# Patient Record
Sex: Female | Born: 1985 | Race: Black or African American | Hispanic: No | Marital: Married | Smoking: Never smoker
Health system: Southern US, Community
[De-identification: ages and names within clinical notes are randomized; demographics above are authoritative.]

---
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
## 2010-07-11 ENCOUNTER — Emergency Department (HOSPITAL_COMMUNITY)
Admission: EM | Admit: 2010-07-11 | Discharge: 2010-07-11 | Payer: Self-pay | Source: Home / Self Care | Admitting: Emergency Medicine

## 2010-10-27 LAB — URINALYSIS, ROUTINE W REFLEX MICROSCOPIC
Bilirubin Urine: NEGATIVE
Hgb urine dipstick: NEGATIVE
Ketones, ur: NEGATIVE mg/dL
Nitrite: NEGATIVE
Specific Gravity, Urine: 1.015 (ref 1.005–1.030)
Urobilinogen, UA: 1 mg/dL (ref 0.0–1.0)

## 2010-10-27 LAB — GC/CHLAMYDIA PROBE AMP, GENITAL
Chlamydia, DNA Probe: POSITIVE — AB
GC Probe Amp, Genital: NEGATIVE

## 2010-10-27 LAB — WET PREP, GENITAL
Trich, Wet Prep: NONE SEEN
Yeast Wet Prep HPF POC: NONE SEEN

## 2011-12-27 IMAGING — CR DG CHEST 2V
2 series · 2 of 2 positions shown · non-contrast
Comparison: None.

CLINICAL DATA: Weakness and coughing up blood.

CHEST - 2 VIEW

[w chest pa *]
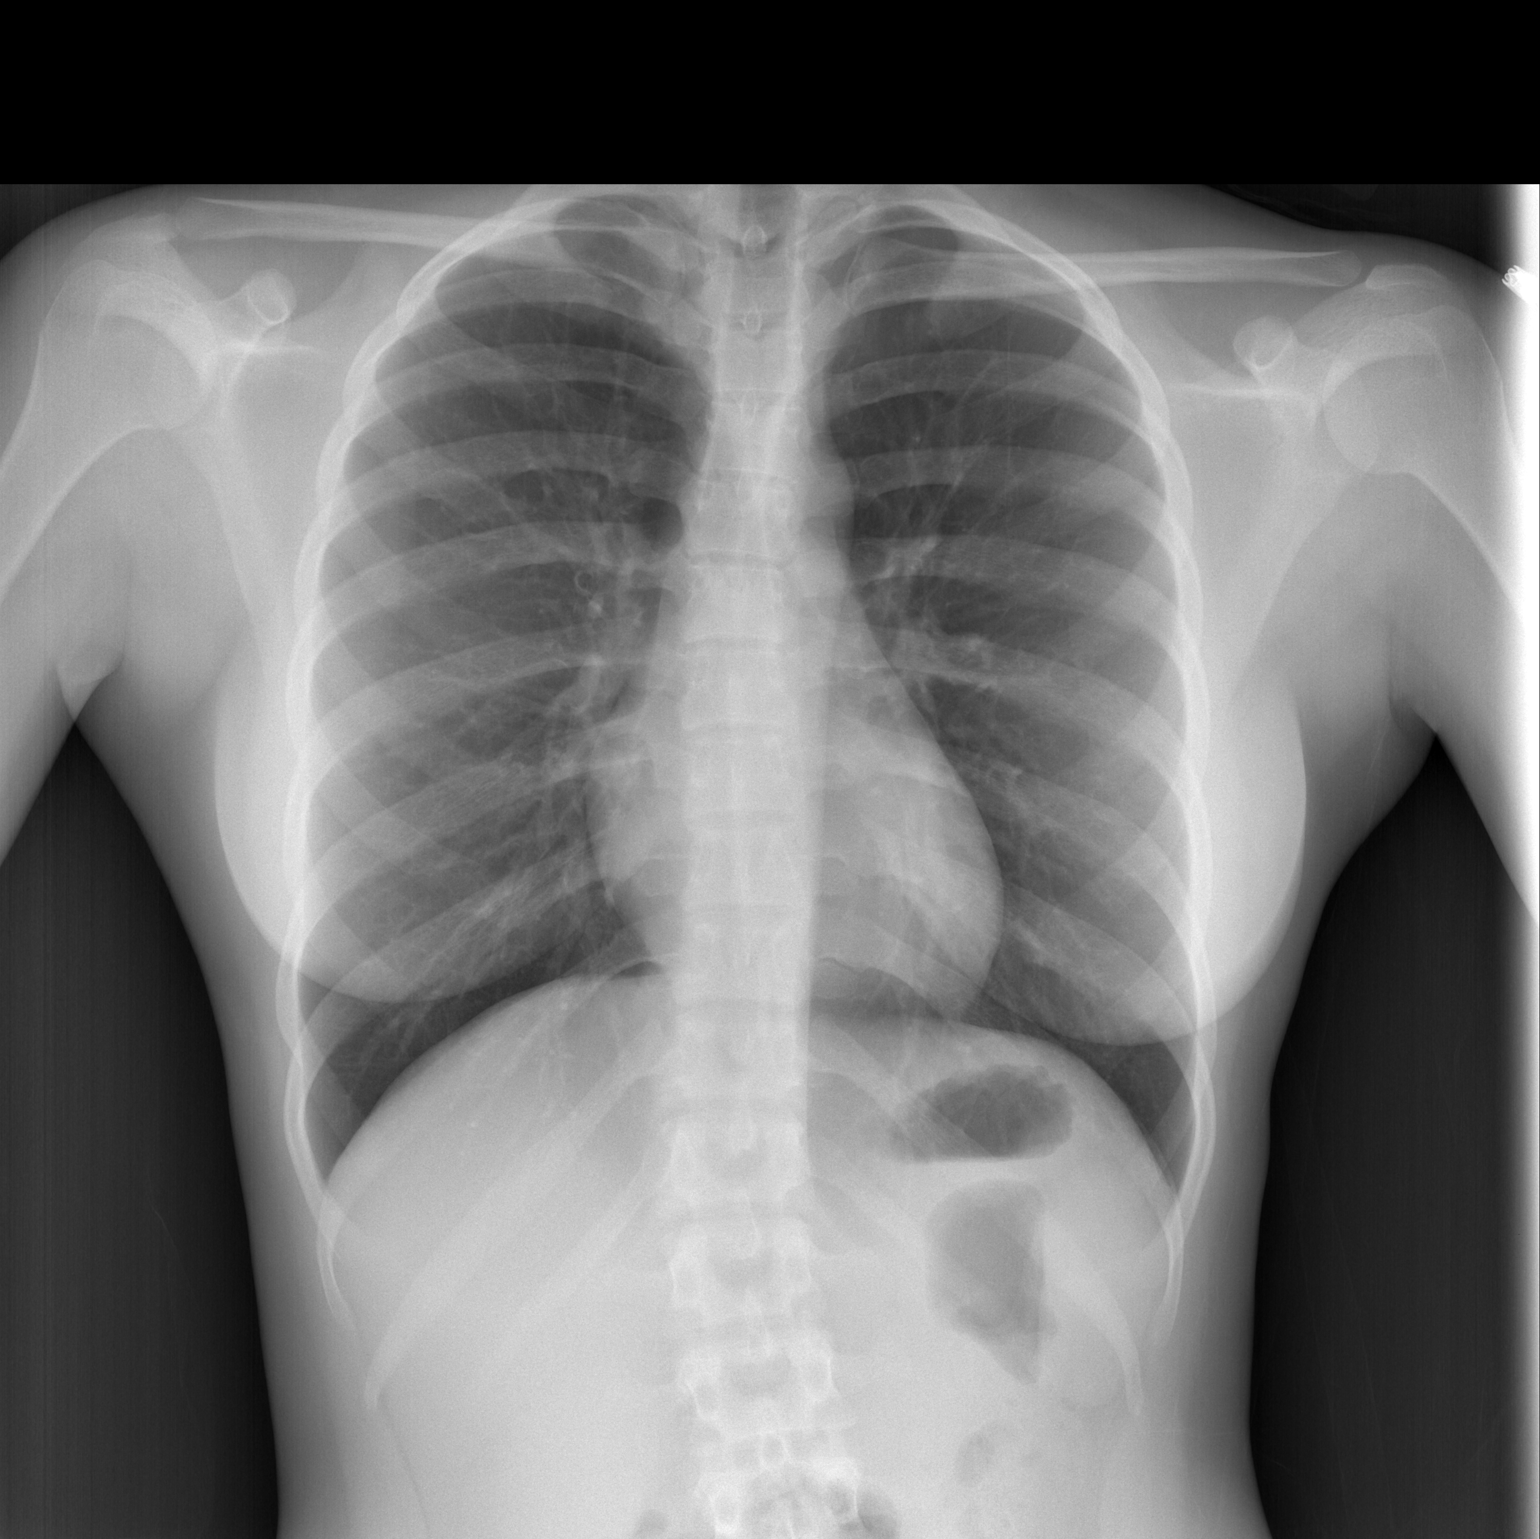

[w chest lat *]
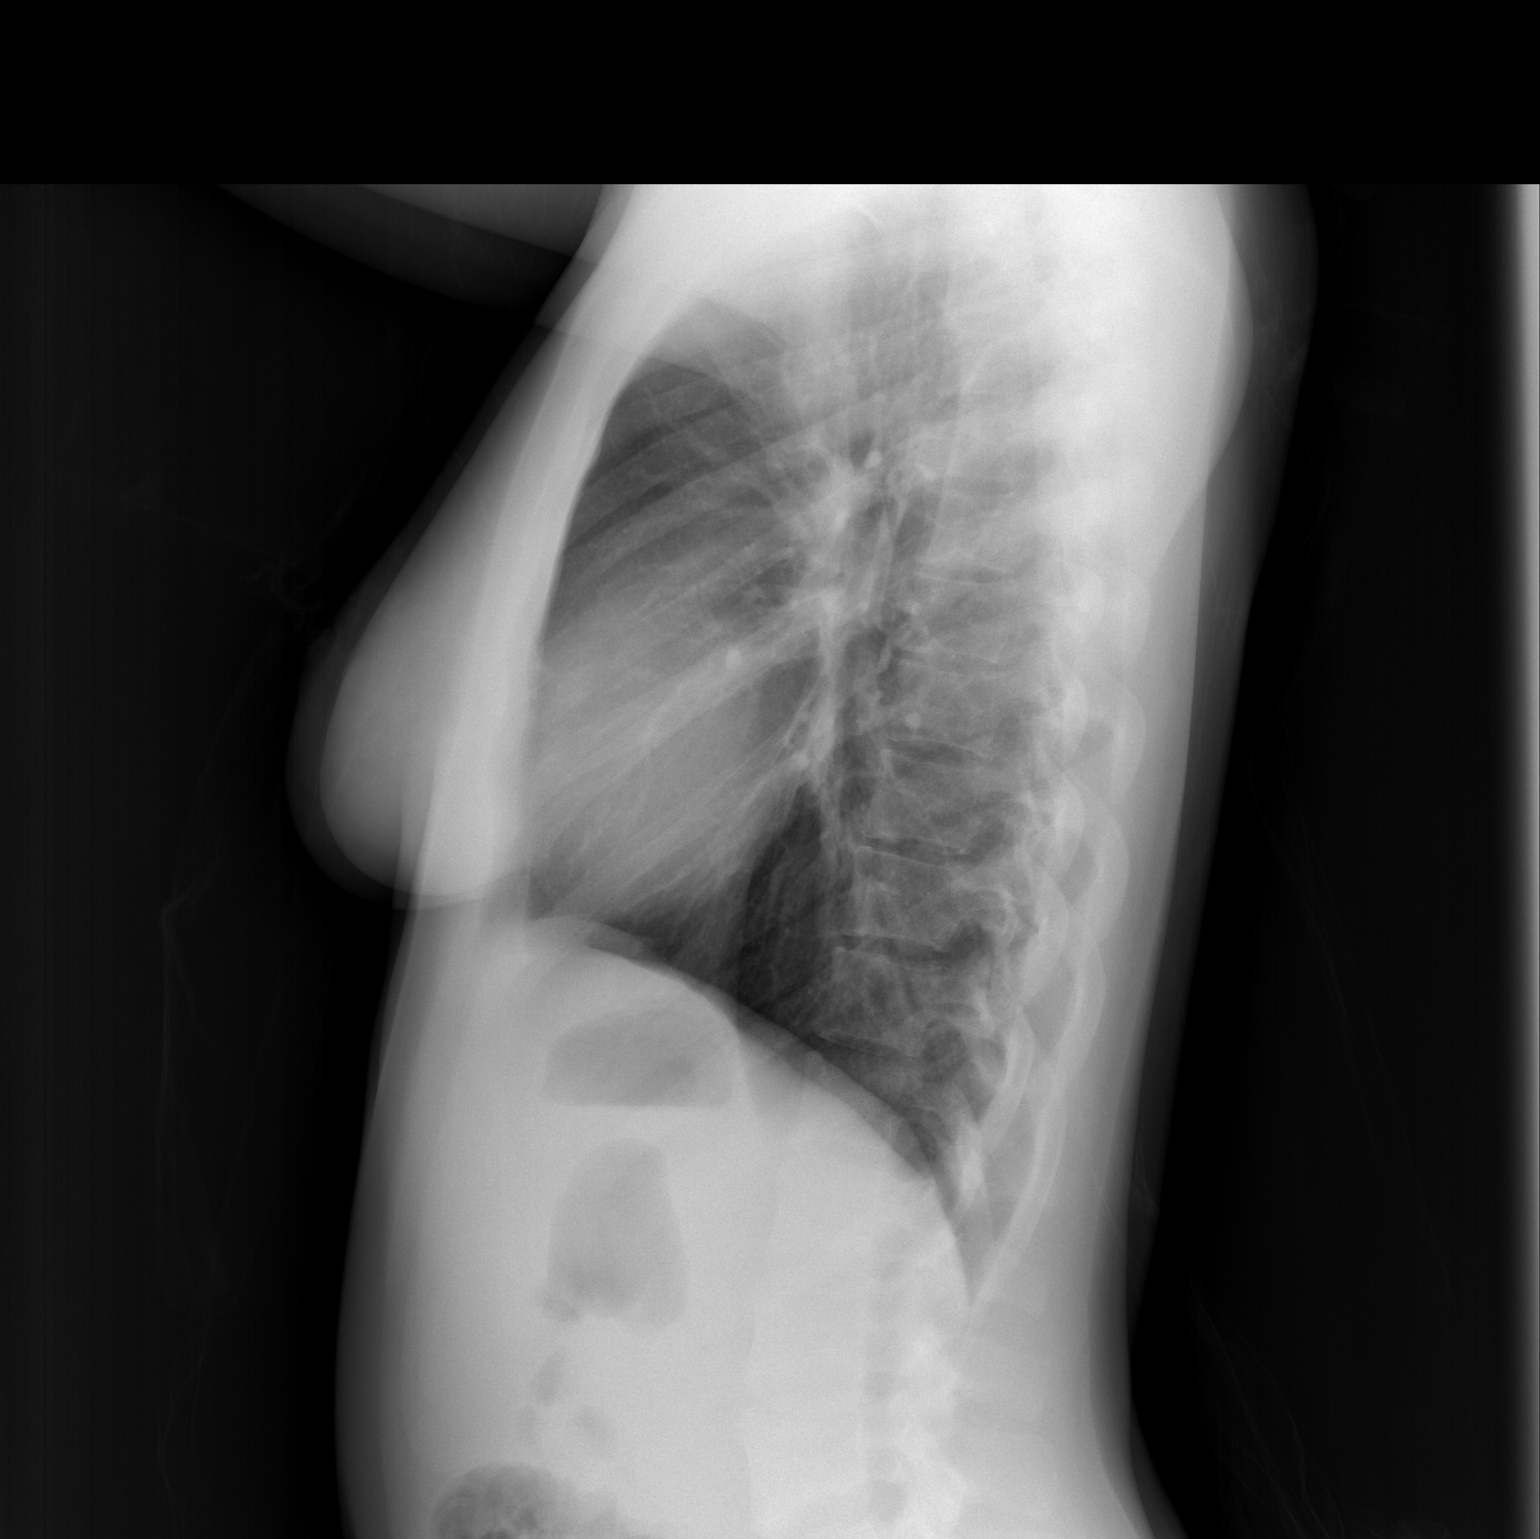

[2 of 2 positions shown; findings below may reference images not displayed]

FINDINGS: Normal heart, mediastinal, and hilar contours.  The
trachea is midline.  The lungs are normally expanded and clear.
There is no pleural effusion.  There was is again may be mild
convex left curvature of the lumbar spine, partially imaged, versus
curvature related to patient positioning.  No acute bony
abnormality.
IMPRESSION: No acute cardiopulmonary disease.

## 2013-10-21 ENCOUNTER — Emergency Department (HOSPITAL_COMMUNITY): Payer: BC Managed Care – PPO

## 2013-10-21 ENCOUNTER — Emergency Department (HOSPITAL_COMMUNITY)
Admission: EM | Admit: 2013-10-21 | Discharge: 2013-10-22 | Disposition: A | Discharge status: Home / Self Care | Payer: BC Managed Care – PPO | Attending: Emergency Medicine | Admitting: Emergency Medicine

## 2013-10-21 ENCOUNTER — Encounter (HOSPITAL_COMMUNITY): Payer: Self-pay | Admitting: Emergency Medicine

## 2013-10-21 DIAGNOSIS — Z3202 Encounter for pregnancy test, result negative: Secondary | ICD-10-CM | POA: Insufficient documentation

## 2013-10-21 DIAGNOSIS — Z8619 Personal history of other infectious and parasitic diseases: Secondary | ICD-10-CM | POA: Insufficient documentation

## 2013-10-21 DIAGNOSIS — N83209 Unspecified ovarian cyst, unspecified side: Secondary | ICD-10-CM

## 2013-10-21 LAB — URINALYSIS, ROUTINE W REFLEX MICROSCOPIC
BILIRUBIN URINE: NEGATIVE
GLUCOSE, UA: NEGATIVE mg/dL
HGB URINE DIPSTICK: NEGATIVE
KETONES UR: NEGATIVE mg/dL
Nitrite: NEGATIVE
PH: 5.5 (ref 5.0–8.0)
Protein, ur: NEGATIVE mg/dL
Specific Gravity, Urine: 1.019 (ref 1.005–1.030)
Urobilinogen, UA: 0.2 mg/dL (ref 0.0–1.0)

## 2013-10-21 LAB — CBC WITH DIFFERENTIAL/PLATELET
BASOS ABS: 0 10*3/uL (ref 0.0–0.1)
BASOS PCT: 0 % (ref 0–1)
Eosinophils Absolute: 0.1 10*3/uL (ref 0.0–0.7)
Eosinophils Relative: 1 % (ref 0–5)
HCT: 35 % — ABNORMAL LOW (ref 36.0–46.0)
HEMOGLOBIN: 12.1 g/dL (ref 12.0–15.0)
Lymphocytes Relative: 11 % — ABNORMAL LOW (ref 12–46)
Lymphs Abs: 1.1 10*3/uL (ref 0.7–4.0)
MCH: 27.3 pg (ref 26.0–34.0)
MCHC: 34.6 g/dL (ref 30.0–36.0)
MCV: 78.8 fL (ref 78.0–100.0)
MONOS PCT: 5 % (ref 3–12)
Monocytes Absolute: 0.6 10*3/uL (ref 0.1–1.0)
NEUTROS ABS: 8.7 10*3/uL — AB (ref 1.7–7.7)
NEUTROS PCT: 83 % — AB (ref 43–77)
Platelets: 249 10*3/uL (ref 150–400)
RBC: 4.44 MIL/uL (ref 3.87–5.11)
RDW: 13.9 % (ref 11.5–15.5)
WBC: 10.4 10*3/uL (ref 4.0–10.5)

## 2013-10-21 LAB — URINE MICROSCOPIC-ADD ON

## 2013-10-21 LAB — COMPREHENSIVE METABOLIC PANEL
ALBUMIN: 3.9 g/dL (ref 3.5–5.2)
ALK PHOS: 39 U/L (ref 39–117)
ALT: 7 U/L (ref 0–35)
AST: 13 U/L (ref 0–37)
BILIRUBIN TOTAL: 0.4 mg/dL (ref 0.3–1.2)
BUN: 10 mg/dL (ref 6–23)
CHLORIDE: 103 meq/L (ref 96–112)
CO2: 25 mEq/L (ref 19–32)
Calcium: 9.3 mg/dL (ref 8.4–10.5)
Creatinine, Ser: 0.72 mg/dL (ref 0.50–1.10)
GFR calc Af Amer: >90 mL/min (ref >90)
GFR calc non Af Amer: >90 mL/min (ref >90)
Glucose, Bld: 91 mg/dL (ref 70–99)
POTASSIUM: 3.8 meq/L (ref 3.7–5.3)
SODIUM: 139 meq/L (ref 137–147)
Total Protein: 6.8 g/dL (ref 6.0–8.3)

## 2013-10-21 LAB — PREGNANCY, URINE: Preg Test, Ur: NEGATIVE

## 2013-10-21 MED ORDER — NAPROXEN 250 MG PO TABS
500.0000 mg | ORAL_TABLET | Freq: Once | ORAL | Status: AC
  Administered 2013-10-21: 500 mg via ORAL
  Filled 2013-10-21: qty 2

## 2013-10-21 NOTE — ED Notes (Signed)
Patient presents with c/o lower abd pain that started out of nowhere about 4 this afternoon.  Denies difficulty urinating, no burning, denies vaginal discharge

## 2013-10-21 NOTE — ED Provider Notes (Signed)
CSN: [id number]     Arrival date & time 10/21/13  1954 History   First MD Initiated Contact with Patient 10/21/13 2322     Chief Complaint  Patient presents with  . Abdominal Pain     (Consider location/radiation/quality/duration/timing/severity/associated sxs/prior Treatment) HPI Comments: Patient is a 28 year old female, history of no significant medical problems, no prior abdominal surgical history, denies being pregnant but admits to sexual history with one sexual partner. She presents with suprapubic located abdominal discomfort described as a cramping feeling which is persistent since 4:00 PM. She was at work when it started. It is constant, nothing seems to make it better, worse with palpation and movement. There is no associated radiation to the back or the legs, no vaginal discharge outside of her normal amount of small amount of vaginal discharge, no bleeding, no dysuria constipation diarrhea or back pain. She has no shortness of breath chest pain swelling or rashes. She has no history of ovarian cysts and has never had abdominal surgery. No medications prior to arrival. She does have a history of vaginal infections last treated for gonorrhea Chlamydia 2 years ago  Patient is a 28 y.o. female presenting with abdominal pain. The history is provided by the patient.  Abdominal Pain   History reviewed. No pertinent past medical history. History reviewed. No pertinent past surgical history. History reviewed. No pertinent family history. History  Substance Use Topics  . Smoking status: Never Smoker   . Smokeless tobacco: Never Used  . Alcohol Use: Yes   OB History   Grav Para Term Preterm Abortions TAB SAB Ect Mult Living                 Review of Systems  Gastrointestinal: Positive for abdominal pain.  All other systems reviewed and are negative.      Allergies  Review of patient's allergies indicates no known allergies.  Home Medications   Current Outpatient Rx  Name   Route  Sig  Dispense  Refill  . HYDROcodone-acetaminophen (NORCO/VICODIN) 5-325 MG per tablet   Oral   Take 2 tablets by mouth every 4 (four) hours as needed.   10 tablet   0   . naproxen (NAPROSYN) 500 MG tablet   Oral   Take 1 tablet (500 mg total) by mouth 2 (two) times daily with a meal.   30 tablet   0    BP 103/60  Pulse 80  Temp(Src) 98 F (36.7 C) (Oral)  Resp 20  Ht 5\' 2"  (1.575 m)  Wt 110 lb (49.896 kg)  BMI 20.11 kg/m2  SpO2 98%  LMP 10/09/2013 Physical Exam  Nursing note and vitals reviewed. Constitutional: She appears well-developed and well-nourished. No distress.  HENT:  Head: Normocephalic and atraumatic.  Mouth/Throat: Oropharynx is clear and moist. No oropharyngeal exudate.  Eyes: Conjunctivae and EOM are normal. Pupils are equal, round, and reactive to light. Right eye exhibits no discharge. Left eye exhibits no discharge. No scleral icterus.  Neck: Normal range of motion. Neck supple. No JVD present. No thyromegaly present.  Cardiovascular: Normal rate, regular rhythm, normal heart sounds and intact distal pulses.  Exam reveals no gallop and no friction rub.   No murmur heard. Pulmonary/Chest: Effort normal and breath sounds normal. No respiratory distress. She has no wheezes. She has no rales.  Abdominal: Soft. Bowel sounds are normal. She exhibits no distension and no mass. There is tenderness ( Suprapubic tenderness with mild guarding, no peritoneal signs, no pain at McBurney's point,  no pain in the left lower quadrant, no upper abdominal tenderness, no CVA tenderness).  Musculoskeletal: Normal range of motion. She exhibits no edema and no tenderness.  Lymphadenopathy:    She has no cervical adenopathy.  Neurological: She is alert. Coordination normal.  Skin: Skin is warm and dry. No rash noted. No erythema.  Psychiatric: She has a normal mood and affect. Her behavior is normal.    ED Course  Procedures (including critical care time) Labs  Review Labs Reviewed  CBC WITH DIFFERENTIAL - Abnormal; Notable for the following:    HCT 35.0 (*)    Neutrophils Relative % 83 (*)    Neutro Abs 8.7 (*)    Lymphocytes Relative 11 (*)    All other components within normal limits  URINALYSIS, ROUTINE W REFLEX MICROSCOPIC - Abnormal; Notable for the following:    APPearance CLOUDY (*)    Leukocytes, UA MODERATE (*)    All other components within normal limits  URINE MICROSCOPIC-ADD ON - Abnormal; Notable for the following:    Squamous Epithelial / LPF MANY (*)    Bacteria, UA FEW (*)    All other components within normal limits  GC/CHLAMYDIA PROBE AMP  WET PREP, GENITAL  COMPREHENSIVE METABOLIC PANEL  PREGNANCY, URINE  HIV ANTIBODY (ROUTINE TESTING)   Imaging Review US Transvaginal Non-ob  10/22/2013   CLINICAL DATA:  Lower abdominal pain.  EXAM: TRANSABDOMINAL AND TRANSVAGINAL ULTRASOUND OF PELVIS  DOPPLER ULTRASOUND OF OVARIES  TECHNIQUE: Both transabdominal and transvaginal ultrasound examinations of the pelvis were performed. Transabdominal technique was performed for global imaging of the pelvis including uterus, ovaries, adnexal regions, and pelvic cul-de-sac.  It was necessary to proceed with endovaginal exam following the transabdominal exam to visualize the uterus, endometrium and ovaries. Color and duplex Doppler ultrasound was utilized to evaluate blood flow to the ovaries.  COMPARISON:  None.  FINDINGS: Uterus  Measurements: 9.0 x 5.4 x 4.2 cm. No fibroids or other mass visualized.  Endometrium  Thickness: 1.5 mm.  No focal abnormality visualized.  Right ovary  Measurements: 3.2 x 2.5 x 1.2 cm. Normal appearance/no adnexal mass.  Left ovary  Measurements: 3.9 x 3.1 x 2.8 cm. Complex lesion measuring 1.7 x 1.4 x 1.3 cm is noted in left ovary concerning for hemorrhagic cyst. Mild to moderate amount of free fluid is noted in the left adnexa as well as posterior cul-de-sac suggesting ruptured ovarian cyst.  Pulsed Doppler evaluation of  both ovaries demonstrates normal low-resistance arterial and venous waveforms.  Other findings  Mild to moderate amount of free fluid is noted in left adnexa as well as posterior cul-de-sac.  IMPRESSION: Mild to moderate amount of free fluid is seen in the left adnexa and posterior cul-de-sac with 1.7 cm complex left ovarian cyst or lesion present consistent with hemorrhagic or ruptured ovarian cyst.   Electronically Signed   By: Roque Lias M.D.   On: 10/22/2013 01:01   US Pelvis Complete  10/22/2013   CLINICAL DATA:  Lower abdominal pain.  EXAM: TRANSABDOMINAL AND TRANSVAGINAL ULTRASOUND OF PELVIS  DOPPLER ULTRASOUND OF OVARIES  TECHNIQUE: Both transabdominal and transvaginal ultrasound examinations of the pelvis were performed. Transabdominal technique was performed for global imaging of the pelvis including uterus, ovaries, adnexal regions, and pelvic cul-de-sac.  It was necessary to proceed with endovaginal exam following the transabdominal exam to visualize the uterus, endometrium and ovaries. Color and duplex Doppler ultrasound was utilized to evaluate blood flow to the ovaries.  COMPARISON:  None.  FINDINGS:  Uterus  Measurements: 9.0 x 5.4 x 4.2 cm. No fibroids or other mass visualized.  Endometrium  Thickness: 1.5 mm.  No focal abnormality visualized.  Right ovary  Measurements: 3.2 x 2.5 x 1.2 cm. Normal appearance/no adnexal mass.  Left ovary  Measurements: 3.9 x 3.1 x 2.8 cm. Complex lesion measuring 1.7 x 1.4 x 1.3 cm is noted in left ovary concerning for hemorrhagic cyst. Mild to moderate amount of free fluid is noted in the left adnexa as well as posterior cul-de-sac suggesting ruptured ovarian cyst.  Pulsed Doppler evaluation of both ovaries demonstrates normal low-resistance arterial and venous waveforms.  Other findings  Mild to moderate amount of free fluid is noted in left adnexa as well as posterior cul-de-sac.  IMPRESSION: Mild to moderate amount of free fluid is seen in the left adnexa and  posterior cul-de-sac with 1.7 cm complex left ovarian cyst or lesion present consistent with hemorrhagic or ruptured ovarian cyst.   Electronically Signed   By: Roque LiasJames  Green M.D.   On: 10/22/2013 01:01   [place] Art/ven Flow Abd Pelv Doppler  10/22/2013   CLINICAL DATA:  Lower abdominal pain.  EXAM: TRANSABDOMINAL AND TRANSVAGINAL ULTRASOUND OF PELVIS  DOPPLER ULTRASOUND OF OVARIES  TECHNIQUE: Both transabdominal and transvaginal ultrasound examinations of the pelvis were performed. Transabdominal technique was performed for global imaging of the pelvis including uterus, ovaries, adnexal regions, and pelvic cul-de-sac.  It was necessary to proceed with endovaginal exam following the transabdominal exam to visualize the uterus, endometrium and ovaries. Color and duplex Doppler ultrasound was utilized to evaluate blood flow to the ovaries.  COMPARISON:  None.  FINDINGS: Uterus  Measurements: 9.0 x 5.4 x 4.2 cm. No fibroids or other mass visualized.  Endometrium  Thickness: 1.5 mm.  No focal abnormality visualized.  Right ovary  Measurements: 3.2 x 2.5 x 1.2 cm. Normal appearance/no adnexal mass.  Left ovary  Measurements: 3.9 x 3.1 x 2.8 cm. Complex lesion measuring 1.7 x 1.4 x 1.3 cm is noted in left ovary concerning for hemorrhagic cyst. Mild to moderate amount of free fluid is noted in the left adnexa as well as posterior cul-de-sac suggesting ruptured ovarian cyst.  Pulsed Doppler evaluation of both ovaries demonstrates normal low-resistance arterial and venous waveforms.  Other findings  Mild to moderate amount of free fluid is noted in left adnexa as well as posterior cul-de-sac.  IMPRESSION: Mild to moderate amount of free fluid is seen in the left adnexa and posterior cul-de-sac with 1.7 cm complex left ovarian cyst or lesion present consistent with hemorrhagic or ruptured ovarian cyst.   Electronically Signed   By: Roque LiasJames  Green M.D.   On: 10/22/2013 01:01     EKG Interpretation None      MDM   Final  diagnoses:  Hemorrhagic ovarian cyst     overall the patient is well appearing with normal vital signs and normal laboratory workup, not pregnant, urine with contamination but no signs of infection, will need pelvic exam, possibly ultrasound as I would consider ovarian cyst or hemorrhagic cyst is a possibility.  The patient has improved symptoms, stable abdomen, ultrasound reveals an increased amount of free pelvic fluid, likely related to hemorrhagic ovarian cyst. She will return to the gynecologist as needed   Meds given in ED:  Medications  ketorolac (TORADOL) injection 60 mg (not administered)  naproxen (NAPROSYN) tablet 500 mg (500 mg Oral Given 10/21/13 2335)    New Prescriptions   HYDROCODONE-ACETAMINOPHEN (NORCO/VICODIN) 5-325 MG PER TABLET  Take 2 tablets by mouth every 4 (four) hours as needed.   NAPROXEN (NAPROSYN) 500 MG TABLET    Take 1 tablet (500 mg total) by mouth 2 (two) times daily with a meal.      Vida Roller, MD 10/22/13 [phone]

## 2013-10-22 LAB — HIV ANTIBODY (ROUTINE TESTING W REFLEX): HIV: NONREACTIVE

## 2013-10-22 MED ORDER — KETOROLAC TROMETHAMINE 60 MG/2ML IM SOLN
60.0000 mg | Freq: Once | INTRAMUSCULAR | Status: AC
  Administered 2013-10-22: 60 mg via INTRAMUSCULAR
  Filled 2013-10-22: qty 2

## 2013-10-22 MED ORDER — NAPROXEN 500 MG PO TABS: 500.0000 mg | ORAL_TABLET | Freq: Two times a day (BID) | ORAL | Status: DC

## 2013-10-22 MED ORDER — HYDROCODONE-ACETAMINOPHEN 5-325 MG PO TABS: 2.0000 | ORAL_TABLET | ORAL | Status: DC | PRN

## 2013-10-22 NOTE — ED Notes (Signed)
Patient currently waiting med time before discharge.  

## 2013-10-22 NOTE — Discharge Instructions (Signed)
°Emergency Department Resource Guide °1) Find a Doctor and Pay Out of Pocket °Although you won't have to find out who is covered by your insurance plan, it is a good idea to ask around and get recommendations. You will then need to call the office and see if the doctor you have chosen will accept you as a new patient and what types of options they offer for patients who are self-pay. Some doctors offer discounts or will set up payment plans for their patients who do not have insurance, but you will need to ask so you aren't surprised when you get to your appointment. ° °2) Contact Your Local Health Department °Not all health departments have doctors that can see patients for sick visits, but many do, so it is worth a call to see if yours does. If you don't know where your local health department is, you can check in your phone book. The CDC also has a tool to help you locate your state's health department, and many state websites also have listings of all of their local health departments. ° °3) Find a Walk-in Clinic °If your illness is not likely to be very severe or complicated, you may want to try a walk in clinic. These are popping up all over the country in pharmacies, drugstores, and shopping centers. They're usually staffed by nurse practitioners or physician assistants that have been trained to treat common illnesses and complaints. They're usually fairly quick and inexpensive. However, if you have serious medical issues or chronic medical problems, these are probably not your best option. ° °No Primary Care Doctor: °- Call [facility] at  832-8000 - they can help you locate a primary care doctor that  accepts your insurance, provides certain services, etc. °- Physician Referral Service- 1-800-533-3463 ° °Chronic Pain Problems: °Organization         Address  Phone   Notes  °[facility]  ([phone]) [phone] Patients need to be referred by their primary care doctor.  ° °Medication  Assistance: °Organization         Address  Phone   Notes  °[place] [facility] [address] °[place], [place] [address] (336) 641-8030 --Must be a resident of [place] °-- Must have NO insurance coverage whatsoever (no Medicaid/ Medicare, etc.) °-- The pt. MUST have a primary care doctor that directs their care regularly and follows them in the community °  °[facility]  (866) 331-1348   °[employer]  (888) 892-1162   ° °Agencies that provide inexpensive medical care: °Organization         Address  Phone   Notes  °[facility]  (336) 832-8035   °[facility]    (336) 832-7272   °[facility] [address] °[place], [place] [address] (336) 832-4777   °[facility] [address], °[place] (336) 271-4999   °[facility]    (336) 373-0678   °[facility]    (336) 272-1050   °[facility] ° [address], [place] Phone:  (336) 832-4444, Fax:  (336) 832-4440 Hours of Operation:  9 am - 6 pm, M-F.  Also accepts Medicaid/Medicare and self-pay.  °[facility] ° [address], [place] Phone: (336) 832-3150, Fax: (336) [phone]. Hours of Operation:  8:30 am - 5:30 pm, M-F.  Also accepts Medicaid and self-pay.  °[facility] [place] [address], [place] Phone: (336) 878-6027   °[facility] [address], [place], [place] (336)723-1848, Ext. 123 Mondays & Thursdays: 7-9 AM.  First 15 patients are seen on a first come, first serve basis. °  ° °Medicaid-accepting [place] Providers: ° °Organization         Address  Phone   Notes  °[facility] [address], [place] (336) 641-2100 Also accepts self-pay patients.  °[facility] [address], [place] ° (336) 856-9996   °[facility] [address], [place]  (336) 288-8857   °[facility] [address] [place] [address], [place] (336) 299-7000   °Veita Bland [address], [place]  ° (336) 373-1557 Only accepts [place] Access Medicaid patients after they have their name applied to their card.  ° °Self-Pay (no insurance) in [place]: ° °Organization         Address  Phone   Notes  °Sickle Cell Patients, [facility] [address], [place] (336) 832-1970   °[facility] [address], [place] (336) 832-4400   °[facility] ° [address], [place] (336) 992-4800   °[facility]/Dr. Osei-Bonsu ° [address] [place] [address], [place] or [address], [place] (336) [phone] Phone number for both [place] and [place] locations is the same.  °[facility] [address], [place] (336) 299-0000   °[facility] [address] [place] [address], [place] or [address] (336) 852-7530 °(336) 878-2260   °[facility] [address], [place] (336) 350-1642, phone; (336) 294-5005, fax Sees patients 1st and 3rd Saturday of every month.  Must not qualify for public or private insurance (i.e. Medicaid, Medicare, [place] Health Choice, Veterans' Benefits) • Household income should be no more than 200% of the poverty level •The clinic cannot treat you if you are pregnant or think you are pregnant • Sexually transmitted diseases are not treated at the clinic.  ° ° °Dental Care: °Organization         Address  Phone  Notes  °[place] [facility] [address], [place] (336) 641-6152 Accepts children up to age 21 who are enrolled in Medicaid or [place] Health Choice; pregnant women with a Medicaid card; and children who have applied for Medicaid or [place] Health Choice, but were declined, whose parents can pay a reduced fee at time of service.  °[place]  [facility] [place]  [address], [place] (336) 641-7733 Accepts children up to age 21 who are enrolled in Medicaid or [place] Health Choice; pregnant women with a Medicaid card; and children who have applied for Medicaid or [place] Health Choice, but were declined, whose parents can pay a reduced fee at time of service.  °[facility] ° [address],  (336) 641-4533 Patients are seen by appointment only. Walk-ins are not accepted. [facility] will see patients 18 years of age and older. °Monday - Tuesday (8am-5pm) °Most Wednesdays (8:30-5pm) °$30 per visit, cash only  °[facility] ° [address], [place] (336) 641-4533 Patients are seen by appointment only. Walk-ins are not accepted. [facility] will see patients 18 years of age and older. °One   Wednesday Evening (Monthly: Volunteer Based).  $30 per visit, cash only  °[facility]  (919) 537-3737 for adults; Children under age 4, call Graduate Pediatric Dentistry at ([phone]) [phone]. Children aged 4-14, please call (919) [phone] to request a pediatric application. ° Dental services are provided in all areas of dental care including fillings, crowns and bridges, complete and partial dentures, implants, gum treatment, root canals, and extractions. Preventive care is also provided. Treatment is provided to both adults and children. °Patients are selected via a lottery and there is often a waiting list. °  °[facility] [address], °[place] ° (336) 763-8833 [email or web address] °  °[facility] [address], [place], [place] (336)723-1848, Ext. 123 Second and Fourth Thursday of each month, opens at 6:30 AM; Clinic ends at 9 AM.  Patients are seen on a first-come first-served basis, and a limited number are seen during each clinic.  ° °[facility] ° [address], [place], [place] (336) 723-7904    Eligibility Requirements °You must have lived in [place], [place], or [place] counties for at least the last three months. °  You cannot be eligible for state or federal sponsored healthcare insurance, including Veterans Administration, Medicaid, or Medicare. °  You generally cannot be eligible for healthcare insurance through your employer.  °  How to apply: °Eligibility screenings are held every Tuesday and Wednesday afternoon from 1:00 pm until 4:00 pm. You do not need an appointment for the interview!  °[facility] [address], [place], [place] 336-631-2330   °[facility]  336-342-8273   °[facility]  336-703-3100   °[facility]  336-570-6415   ° °Behavioral Health Resources in the Community: °Intensive Outpatient Programs °Organization         Address  Phone  Notes  °[facility] [address], [place], [place] 336-878-6098   °[facility] [address], [place], [place] 336-832-9800   °[facility] [address], [place], [place] ° 336-882-2125   °[facility] [address],  °[place], [place] 1-800-853-5163 or 336-641-4981   °Substance Abuse Resources °Organization         Address  Phone  Notes  °[facility]  336-882-2125   °[facility]  336-784-9470   °[facility]  336-285-9073   °[facility]  336-845-3988   °Residential & Outpatient Substance Abuse Program  1-800-659-3381   °Psychological Services °Organization         Address  Phone  Notes  °[facility]  336- 832-9600   °[facility]  336- 378-7881   °[facility] [address], [place] 1-800-853-5163 or 336-641-4981   ° °Mobile Crisis Teams °Organization         Address  Phone  Notes  °[facility], Mobile Crisis Care Unit  1-877-626-1772   °Assertive °[facility] ° [address]  [place], [place] 336-834-9664   °Sharon DeEsch [address] °[place] 336-554-5454   ° °Self-Help/Support Groups °Organization         Address  Phone             Notes  °[facility] - variety of support groups  336- 373-1402 Call for more information  °Narcotics Anonymous (NA), [facility] [address], °[place]   2 meetings at this location  ° °  Residential Treatment Programs °Organization         Address  Phone  Notes  °[facility] [address],    °[place]  1-866-801-8205   °[facility] ° [address], [place], [place] 704-293-8524   °[facility] [address], [place] 336-845-3988 Admissions: 8am-3pm M-F  °[facility] [address]    °[place], [place] 336-841-1104   °[facility] [address], [place], [place] 336-379-7146   °[facility] [address]  °[place], [place] 336-285-9073   °[facility] - Intensive Outpatient [address], [place], [place] 336-852-3033   °[facility] [address]  °[place], [place] 1-877-615-2722 or 336-784-9470   °[facility] [address] [place], [place] 336-227-7417 Accepts Medicaid  °[facility] [address]  °[place] 1-800-659-3381 Substance Abuse/Addiction Treatment  ° °[place] Behavioral Health Resources °Organization         Address  Phone  Notes  °[facility]  (888) 581-9988   °Julie Brannon, PhD [address] [place], [place]   (336) 349-5553 or (336) 951-0000   °[facility]   [address] °[place], [place] (336) 349-4454   °[facility] [address], [place], [place] (336) 342-8316 Insurance/Medicaid/sponsorship through [facility]  °[facility] [address]                                    [place], [place] (336) 342-8316 Therapy/tele-psych/case    °[facility] [address]  ° [place], [place] (336) 349-2233    °Dr. Arfeen  (336) 349-4544   °[facility] [place]  [employer] [place] [facility] 1) [address],  °2) [address], [place] °3)  [address], [place] (336) 349-3220 °(336) 342-7768 ° °(336) 342-8140   °[place] Child Abuse Hotline (336) 342-1394 or (336) 342-3537 (After Hours)    ° ° °

## 2015-04-09 IMAGING — US US TRANSVAGINAL NON-OB
1 series · 13 of 25 positions shown · non-contrast
Comparison: None.

CLINICAL DATA: Lower abdominal pain.

EXAM:
TRANSABDOMINAL AND TRANSVAGINAL ULTRASOUND OF PELVIS
DOPPLER ULTRASOUND OF OVARIES
TECHNIQUE: Both transabdominal and transvaginal ultrasound examinations of the
pelvis were performed. Transabdominal technique was performed for
global imaging of the pelvis including uterus, ovaries, adnexal
regions, and pelvic cul-de-sac.
It was necessary to proceed with endovaginal exam following the
transabdominal exam to visualize the uterus, endometrium and
ovaries. Color and duplex Doppler ultrasound was utilized to
evaluate blood flow to the ovaries.

[Series 1: us transvaginal non-ob · 0.20mm/px · 13 of 69 slices shown]
[im 1/69]
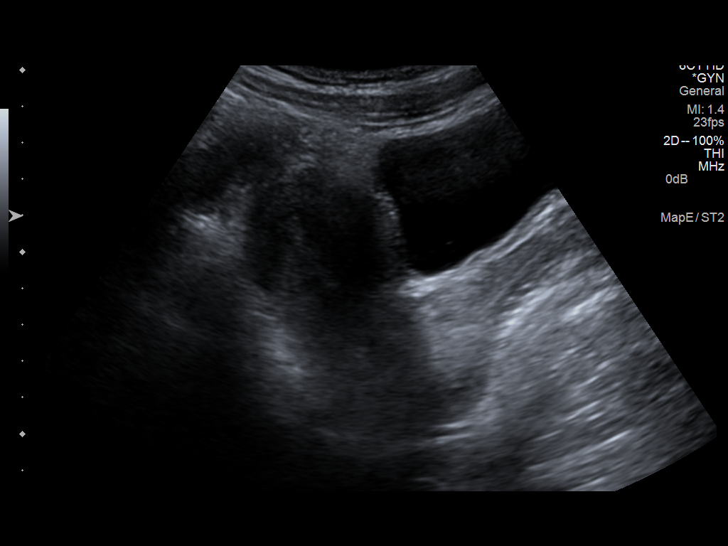
[im 6/69]
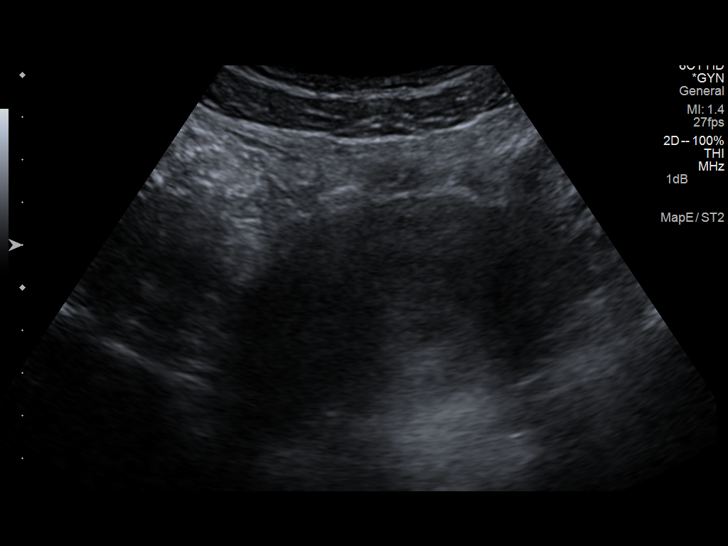
[im 12/69]
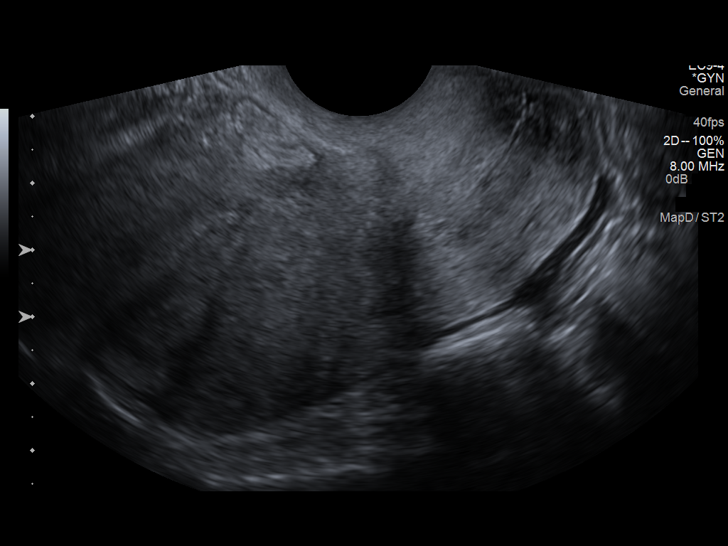
[im 18/69]
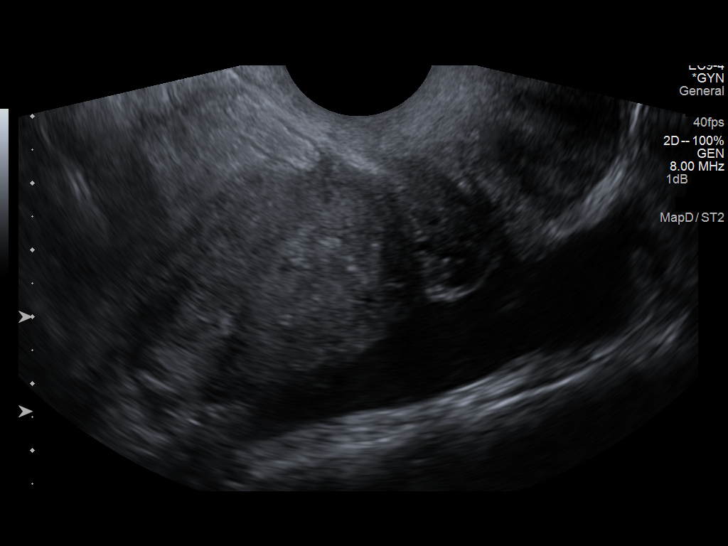
[im 23/69]
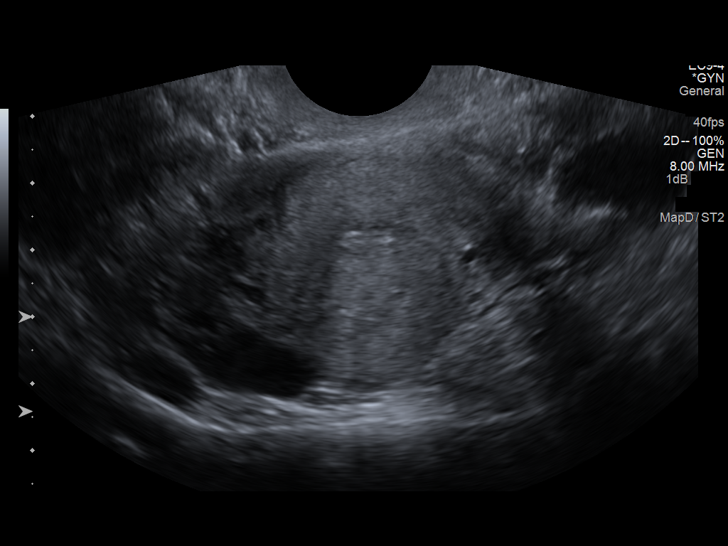
[im 29/69]
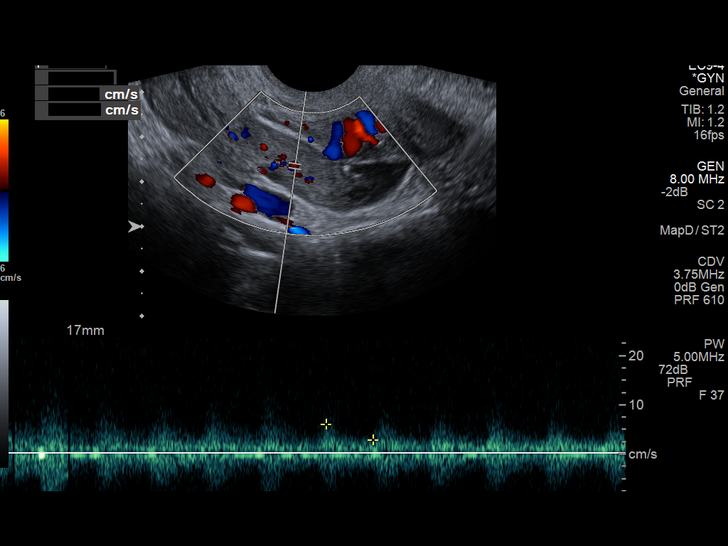
[im 35/69]
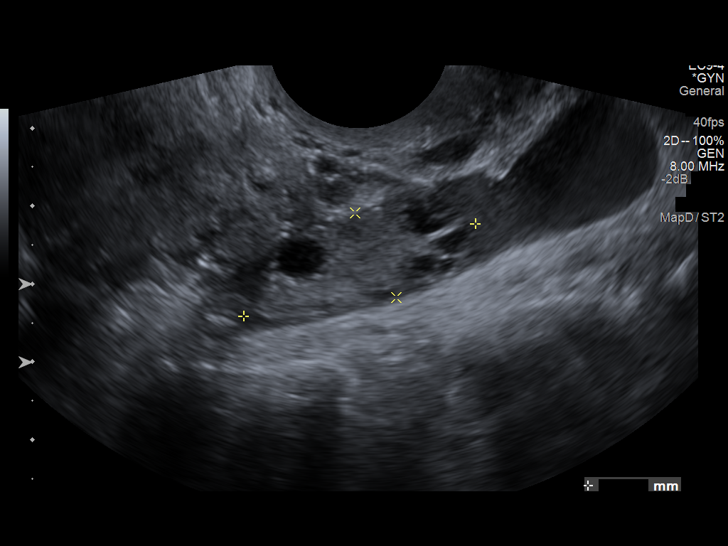
[im 40/69]
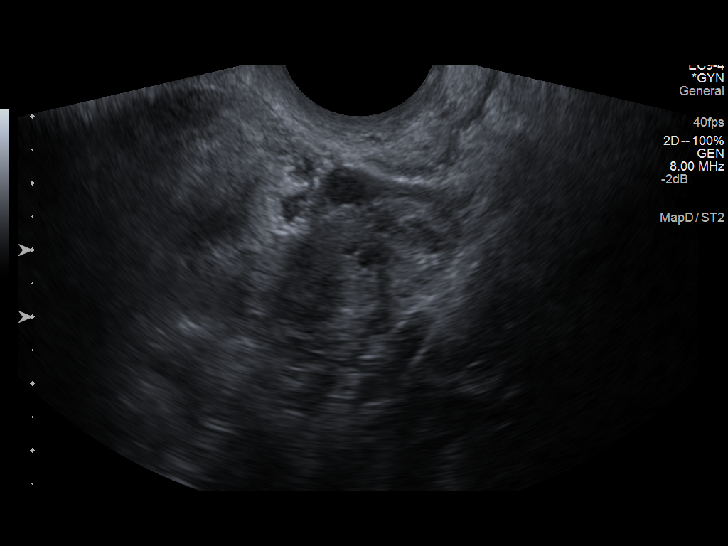
[im 46/69]
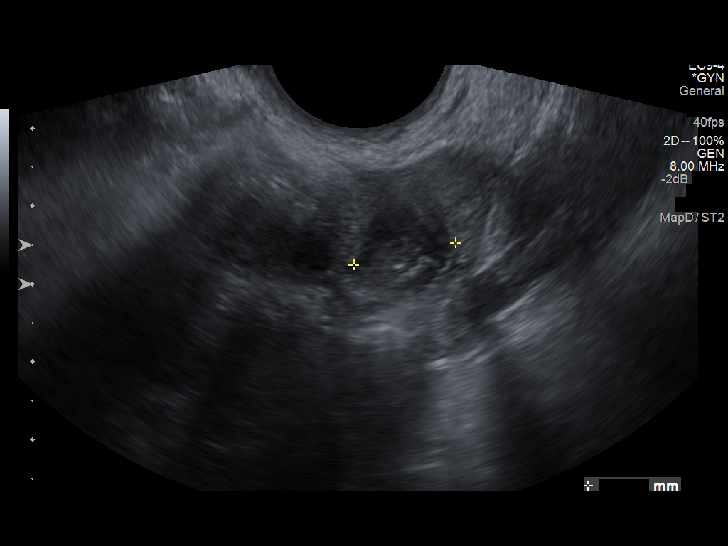
[im 52/69]
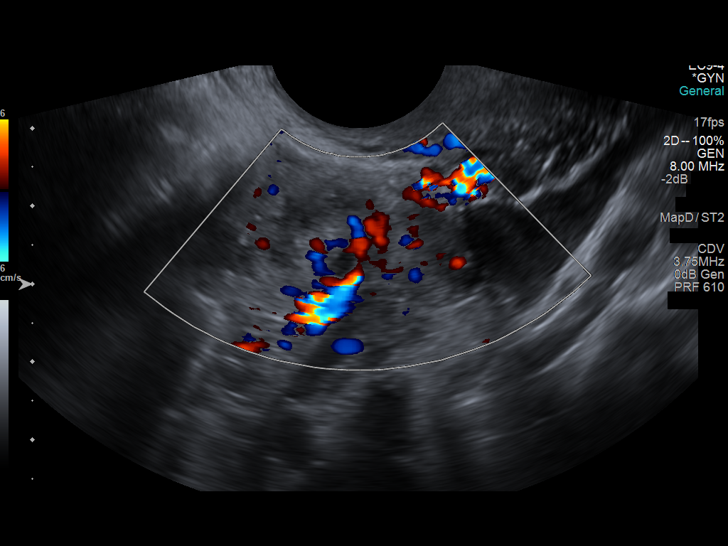
[im 57/69]
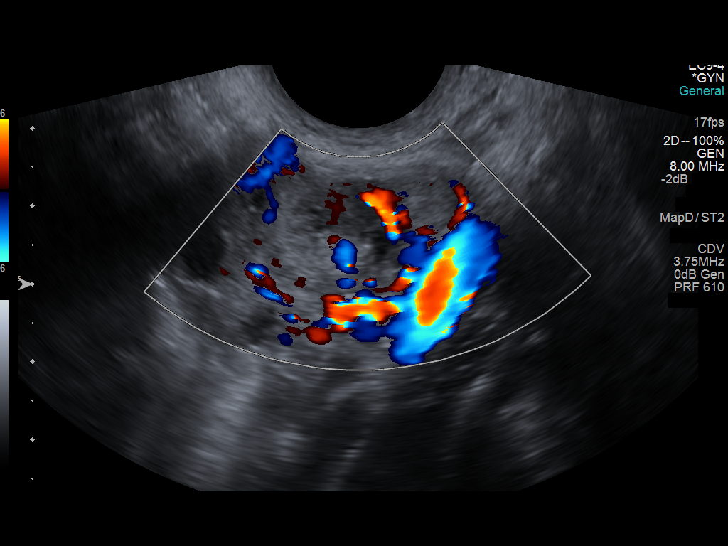
[im 63/69]
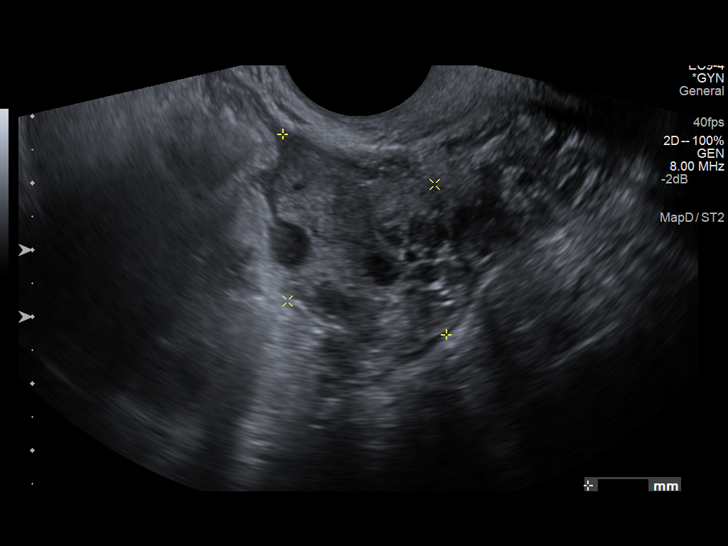
[im 69/69]
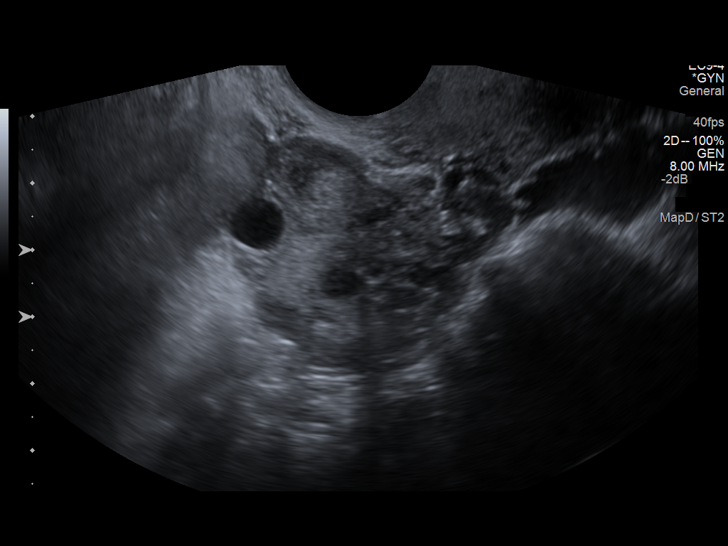

[13 of 25 positions shown; findings below may reference images not displayed]

FINDINGS: Uterus

Measurements: 9.0 x 5.4 x 4.2 cm. No fibroids or other mass
visualized.

Endometrium

Thickness: 1.5 mm.  No focal abnormality visualized.

Right ovary

Measurements: 3.2 x 2.5 x 1.2 cm. Normal appearance/no adnexal mass.

Left ovary

Measurements: 3.9 x 3.1 x 2.8 cm. Complex lesion measuring 1.7 x
x 1.3 cm is noted in left ovary concerning for hemorrhagic cyst.
Mild to moderate amount of free fluid is noted in the left adnexa as
well as posterior cul-de-sac suggesting ruptured ovarian cyst.

Pulsed Doppler evaluation of both ovaries demonstrates normal
low-resistance arterial and venous waveforms.

Other findings

Mild to moderate amount of free fluid is noted in left adnexa as
well as posterior cul-de-sac.
IMPRESSION: Mild to moderate amount of free fluid is seen in the left adnexa and
posterior cul-de-sac with 1.7 cm complex left ovarian cyst or lesion
present consistent with hemorrhagic or ruptured ovarian cyst.

## 2015-06-20 ENCOUNTER — Emergency Department (INDEPENDENT_AMBULATORY_CARE_PROVIDER_SITE_OTHER)
Admission: EM | Admit: 2015-06-20 | Discharge: 2015-06-20 | Disposition: A | Discharge status: Home / Self Care | Payer: 59 | Source: Home / Self Care

## 2015-06-20 ENCOUNTER — Encounter (HOSPITAL_COMMUNITY): Payer: Self-pay | Admitting: Emergency Medicine

## 2015-06-20 DIAGNOSIS — R109 Unspecified abdominal pain: Secondary | ICD-10-CM

## 2015-06-20 DIAGNOSIS — M5489 Other dorsalgia: Secondary | ICD-10-CM | POA: Diagnosis not present

## 2015-06-20 LAB — POCT I-STAT, CHEM 8
BUN: 10 mg/dL (ref 6–20)
CREATININE: 0.9 mg/dL (ref 0.44–1.00)
Calcium, Ion: 1.29 mmol/L — ABNORMAL HIGH (ref 1.12–1.23)
Chloride: 105 mmol/L (ref 101–111)
GLUCOSE: 83 mg/dL (ref 65–99)
HEMATOCRIT: 40 % (ref 36.0–46.0)
Hemoglobin: 13.6 g/dL (ref 12.0–15.0)
POTASSIUM: 3.9 mmol/L (ref 3.5–5.1)
Sodium: 140 mmol/L (ref 135–145)
TCO2: 20 mmol/L (ref 0–100)

## 2015-06-20 LAB — POCT URINALYSIS DIP (DEVICE)
Bilirubin Urine: NEGATIVE
GLUCOSE, UA: NEGATIVE mg/dL
HGB URINE DIPSTICK: NEGATIVE
KETONES UR: NEGATIVE mg/dL
Leukocytes, UA: NEGATIVE
Nitrite: NEGATIVE
PH: 6.5 (ref 5.0–8.0)
PROTEIN: NEGATIVE mg/dL
SPECIFIC GRAVITY, URINE: 1.02 (ref 1.005–1.030)
UROBILINOGEN UA: 0.2 mg/dL (ref 0.0–1.0)

## 2015-06-20 LAB — POCT PREGNANCY, URINE: Preg Test, Ur: NEGATIVE

## 2015-06-20 MED ORDER — ETODOLAC 500 MG PO TABS: 500.0000 mg | ORAL_TABLET | Freq: Two times a day (BID) | ORAL | Status: AC

## 2015-06-20 MED ORDER — TRAMADOL HCL 50 MG PO TABS: 50.0000 mg | ORAL_TABLET | Freq: Four times a day (QID) | ORAL | Status: AC | PRN

## 2015-06-20 NOTE — Discharge Instructions (Signed)
Abdominal Pain, Adult Many things can cause abdominal pain. Usually, abdominal pain is not caused by a disease and will improve without treatment. It can often be observed and treated at home. Your health care provider will do a physical exam and possibly order blood tests and X-rays to help determine the seriousness of your pain. However, in many cases, more time must pass before a clear cause of the pain can be found. Before that point, your health care provider may not know if you need more testing or further treatment. HOME CARE INSTRUCTIONS Monitor your abdominal pain for any changes. The following actions may help to alleviate any discomfort you are experiencing:  Only take over-the-counter or prescription medicines as directed by your health care provider.  Do not take laxatives unless directed to do so by your health care provider.  Try a clear liquid diet (broth, tea, or water) as directed by your health care provider. Slowly move to a bland diet as tolerated. SEEK MEDICAL CARE IF:  You have unexplained abdominal pain.  You have abdominal pain associated with nausea or diarrhea.  You have pain when you urinate or have a bowel movement.  You experience abdominal pain that wakes you in the night.  You have abdominal pain that is worsened or improved by eating food.  You have abdominal pain that is worsened with eating fatty foods.  You have a fever. SEEK IMMEDIATE MEDICAL CARE IF:  Your pain does not go away within 2 hours.  You keep throwing up (vomiting).  Your pain is felt only in portions of the abdomen, such as the right side or the left lower portion of the abdomen.  You pass bloody or black tarry stools. MAKE SURE YOU:  Understand these instructions.  Will watch your condition.  Will get help right away if you are not doing well or get worse.   This information is not intended to replace advice given to you by your health care provider. Make sure you discuss  any questions you have with your health care provider.   Nice to meet you today. It is unclear the source of your discomfort, however no signs of infections. Urine is clear. Exam is good. Suggest a f/u with OBGYN for possible ovarian cyst issues. If you worsen please go to the ED for further imaging. Take the Lodine as needed for pain, the Tramadol is for more severe pain. Use sparingly.      Document Released: 05/11/2005 Document Revised: 04/22/2015 Document Reviewed: 04/10/2013 Elsevier Interactive Patient Education [employer]2016 [employer]

## 2015-06-20 NOTE — ED Notes (Signed)
[facility] a complaint of lower back pain ( primarily right sided ) and her lower abdominal area in the center that has been happening around 3 weeks.

## 2015-06-20 NOTE — ED Provider Notes (Signed)
CSN: [id number]     Arrival date & time 06/20/15  1303 History   None    Chief Complaint  Patient presents with  . Abdominal Pain  . Back Pain   (Consider location/radiation/quality/duration/timing/severity/associated sxs/prior Treatment) HPI Comments: Patient is a 29 yo female who presents with right sided low back pain that radiates to mid lower abdominal area. This has been episodic for 3 weeks. The pain is "mild". Does seem related to menses, but also evident during other times. No fevers or chills. No malaise. No bowel changes. No missed menses. No nausea or emesis.  No dysuria, hematuria or frequency is noted. See remainder of ROS. She is otherwise healthy.   Patient is a 29 y.o. female presenting with abdominal pain and back pain. The history is provided by the patient.  Abdominal Pain Associated symptoms: no chills, no dysuria, no fatigue, no fever and no hematuria   Back Pain Associated symptoms: abdominal pain   Associated symptoms: no dysuria, no fever and no pelvic pain     History reviewed. No pertinent past medical history. History reviewed. No pertinent past surgical history. History reviewed. No pertinent family history. Social History  Substance Use Topics  . Smoking status: Never Smoker   . Smokeless tobacco: Never Used  . Alcohol Use: Yes   OB History    No data available     Review of Systems  Constitutional: Negative for fever, chills and fatigue.  Gastrointestinal: Positive for abdominal pain.  Genitourinary: Negative for dysuria, frequency, hematuria, decreased urine volume and pelvic pain.  Musculoskeletal: Positive for back pain.  Skin: Negative.     Allergies  Review of patient's allergies indicates no known allergies.  Home Medications   Prior to Admission medications   Medication Sig Start Date End Date Taking? Authorizing Provider  etodolac (LODINE) 500 MG tablet Take 1 tablet (500 mg total) by mouth 2 (two) times daily. 06/20/15   Riki Sheer, PA-C  traMADol (ULTRAM) 50 MG tablet Take 1 tablet (50 mg total) by mouth every 6 (six) hours as needed. 06/20/15   Riki Sheer, PA-C   Meds Ordered and Administered this Visit  Medications - No data to display  BP 110/69 mmHg  Pulse 83  Temp(Src) 98.4 F (36.9 C) (Oral)  Resp 16  SpO2 99%  LMP 05/28/2015 (Exact Date) No data found.   Physical Exam  Constitutional: She is oriented to person, place, and time. She appears well-developed and well-nourished. No distress.  Pulmonary/Chest: Effort normal and breath sounds normal.  Abdominal: Soft. Bowel sounds are normal. She exhibits no distension. There is no rebound and no guarding.  Mild tenderness to left lower pelvic region without rebound or garding  Musculoskeletal:  Normal ROM of the low back, no reproduction of her pain  Neurological: She is alert and oriented to person, place, and time.  Skin: Skin is warm and dry. She is not diaphoretic.  Psychiatric: Her behavior is normal.  Nursing note and vitals reviewed.   ED Course  Procedures (including critical care time)  Labs Review Labs Reviewed  POCT I-STAT, CHEM 8 - Abnormal; Notable for the following:    Calcium, Ion 1.29 (*)    All other components within normal limits  POCT URINALYSIS DIP (DEVICE)  POCT PREGNANCY, URINE    Imaging Review No results found.   Visual Acuity Review  Right Eye Distance:   Left Eye Distance:   Bilateral Distance:    Right Eye Near:   Left  Eye Near:    Bilateral Near:         MDM   1. Abdominal cramping   2. Back pain without sciatica    1. Non-toxic appearing with normal urine and ISTAT. Exam without acute abdominal findings. Question if related to ovarian cyst though timing does not entirely match. Given normal serologies and non-acute abdominal findings elected to treat her discomfort with NSAID's and Tramadol if needed. She will scheduled a f/u with her OBGYN which I feel is appropriate as her discomfort  has been episodic for 3 weeks.  Certainly should she develop worsening symptoms go to the ED for further imaging.          Riki SheerMichelle G Hargis Vandyne, PA-C 06/20/15 [phone]
